# Patient Record
Sex: Male | Born: 1960 | Race: White | Hispanic: No | Marital: Married | Smoking: Former smoker
Health system: Southern US, Community
[De-identification: ages and names within clinical notes are randomized; demographics above are authoritative.]

---

## 2000-12-12 ENCOUNTER — Encounter: Payer: Self-pay | Admitting: Neurosurgery

## 2000-12-12 ENCOUNTER — Ambulatory Visit (HOSPITAL_COMMUNITY): Admission: RE | Admit: 2000-12-12 | Discharge: 2000-12-12 | Payer: Self-pay | Admitting: Neurosurgery

## 2000-12-14 ENCOUNTER — Ambulatory Visit (HOSPITAL_COMMUNITY): Admission: RE | Admit: 2000-12-14 | Discharge: 2000-12-15 | Payer: Self-pay | Admitting: Neurosurgery

## 2000-12-14 ENCOUNTER — Encounter: Payer: Self-pay | Admitting: Neurosurgery

## 2009-06-28 ENCOUNTER — Ambulatory Visit: Payer: Self-pay | Admitting: Family Medicine

## 2010-02-22 ENCOUNTER — Encounter: Admission: RE | Admit: 2010-02-22 | Discharge: 2010-02-22 | Payer: Self-pay | Admitting: Neurosurgery

## 2010-09-02 NOTE — Op Note (Signed)
[place]  Patient:    Mitchell Horton, Mitchell Horton Visit Number: [id number] MRN: [id number]          Service Type: DSU Location: 3000 [id number] Attending Physician:  Jackelyn Knife Dictated by:   Izell Marathon Elesa Hacker, M.D. Proc. 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  OPERATION PERFORMED:  Left L4, L5 hemilaminotomy and diskectomy.  SURGEON:  Izell [employer], M.D.  ASSISTANT:  ______ ______, M.D.  DESCRIPTION OF PROCEDURE:  Under general endotracheal anesthesia, the usual subperiosteal approach was carried out to the left L4-L5 interlaminar space. The proper operative level was confirmed with intraoperative x-rays.  Utilizing the operating microscope and high-speed drill, a hemilaminotomy was performed.  Ligament of flavum was removed to reveal the dura sac and nerve root.  The initial effort to retraction of these structures was hampered considerably by medially situated soft tissue mass, which proved to be a quite large completely extruding disk fragment.  This was removed and retraction was greatly facilitated.  One or two other smaller extruded fragments were found and removed.  The thinned annulus was then incised and an additional large quantity of degenerative disk material was removed from within the ______ .  A careful search was made for additional extruded fragments.  The wound was then copiously irrigated with antibiotic solution and closed in layers.  A sterile dressing was applied.  The patient was taken to recovery room in good condition. Dictated by:   Izell Butler Elesa Hacker, M.D. Attending Physician:  Jackelyn Knife DD:  12/14/00 TD:  12/14/00 Job: [phone] [id number]

## 2010-09-02 NOTE — H&P (Signed)
[place]  Patient:    Mitchell Horton Visit Number: [id number] MRN: [id number]          Service Type: DSU Location: [facility] [id number] Attending Physician:  Jackelyn Knife Dictated by:   Izell Sublette Elesa Hacker, M.D. Admit Date:  12/14/2000                           History and Physical  HISTORY:  Mitchell Horton is a 50 year old male known to this neurosurgical service.  He was operated upon here for a herniated intervertebral disk on the left at L5-S1 back in December of 1998.  He did well following that initial operation for several years, but had a recurrent problem and was reoperated upon by Dr. Michela Pitcher. Roy in 1996.  The patient believes that that was for right-sided pain.  Since the second operation, he has had some intermittent difficulty with back pain and some occasional left leg pain.  He had some conservative treatment including epidural steroids.  He had a repeat scan in May of 2002 that showed a new disk protrusion at L4-5 on the left side.  He continued to have left leg pain but this was manageable until about a week or 10 days or so ago when he had an abrupt change in his condition with a worsening of his left leg pain, which has been essentially incapacitating.  He was seen in this office on August 28th and because of a change of his condition, his MRI was repeated on that date at [facility] and this showed enlargement of the already large central disk herniation at L4-5, which had progressed since the Aug 16, 2000 scan.  The L5-S1 small disk protrusion was unchanged.  He is admitted at this time for surgical decompression at L4-5.  PAST MEDICAL HISTORY:  He has been in excellent general health.  FAMILY HISTORY:  His mother and father are living, both at age 56, in reasonably good health.  PREVIOUS SURGERY:  His previous surgery consists of laminectomies as noted above in 1988 and 1996.  CURRENT MEDICATIONS:  He has been on  Lipitor and Vicodin.  ALLERGIES:  He has no known drug allergies.  SOCIAL HISTORY:  Personal history reveals he does not smoke, nor does he use alcohol.  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  He is married.  REVIEW OF SYSTEMS:  Pertinent as mentioned.  PHYSICAL EXAMINATION:  GENERAL:  He is alert and cooperative.  He is in obvious left leg pain and tries to keep weight off of his left buttock and sciatic notch area.  VITAL SIGNS:  He weighs 197 pounds at 5-feet 7-inches tall with a blood pressure of 142/100 and a pulse of 82.  He is afebrile.  HEENT:  Examination is within normal limits.  NECK:  He has no cervical adenopathy, nor does he have bruits.  CHEST:  Clear.  CARDIAC:  Examination reveals no murmurs or enlargement.  ABDOMEN:  Soft and nontender with no palpable masses.  NEUROLOGIC:  Examination reveals sensorium and cranial nerves to be intact. 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  Straight leg raising is negative on the right and extremely painful on the left.  His dorsiflexor strength may be slightly down of the foot and great toe on the left.  There is a good deal of pain produced by  testing and it is difficult to be sure of this, but I think that is an accurate finding.  Romberg testing is negative.  He can walk on his toes but has difficulty walking on his heels because of pain and what I believe to be some dorsiflexor weakness.  He has some loss of sensation slightly over S1 and more so on the left over L5.  IMPRESSION:  Progression of a large disk herniation at L4-5 with a stable L5-S1 right-sided protrusion which has not changed on scan over the last eight years.  RECOMMENDATION:  I have recommended surgical decompression on the left at L4-5.  I discussed this in detail with him and he understands the goals and risks of the procedure.  He knows the risks  include the risks of hemorrhage, infection, damage to common dural tube or nerve roots with resultant loss of sensation and weakness.Dictated by:   Izell  Elesa Hacker, M.D. Attending Physician:  Jackelyn Knife DD:  12/14/00 TD:  12/14/00 Job: [phone] [id number]

## 2014-02-27 ENCOUNTER — Ambulatory Visit: Payer: Self-pay | Admitting: Gastroenterology

## 2014-04-21 ENCOUNTER — Ambulatory Visit: Payer: Self-pay | Admitting: Surgery

## 2014-05-13 ENCOUNTER — Ambulatory Visit: Payer: Self-pay | Admitting: Surgery

## 2014-08-10 LAB — SURGICAL PATHOLOGY

## 2014-10-19 ENCOUNTER — Emergency Department
Admission: EM | Admit: 2014-10-19 | Discharge: 2014-10-19 | Disposition: A | Discharge status: Home / Self Care | Payer: BC Managed Care – PPO | Attending: Emergency Medicine | Admitting: Emergency Medicine

## 2014-10-19 ENCOUNTER — Encounter: Payer: Self-pay | Admitting: *Deleted

## 2014-10-19 DIAGNOSIS — Z87891 Personal history of nicotine dependence: Secondary | ICD-10-CM | POA: Insufficient documentation

## 2014-10-19 DIAGNOSIS — Y93G9 Activity, other involving cooking and grilling: Secondary | ICD-10-CM | POA: Insufficient documentation

## 2014-10-19 DIAGNOSIS — W290XXA Contact with powered kitchen appliance, initial encounter: Secondary | ICD-10-CM | POA: Insufficient documentation

## 2014-10-19 DIAGNOSIS — Y9289 Other specified places as the place of occurrence of the external cause: Secondary | ICD-10-CM | POA: Diagnosis not present

## 2014-10-19 DIAGNOSIS — S61011A Laceration without foreign body of right thumb without damage to nail, initial encounter: Secondary | ICD-10-CM | POA: Insufficient documentation

## 2014-10-19 DIAGNOSIS — Y998 Other external cause status: Secondary | ICD-10-CM | POA: Insufficient documentation

## 2014-10-19 MED ORDER — BACITRACIN-NEOMYCIN-POLYMYXIN 400-5-5000 EX OINT
TOPICAL_OINTMENT | CUTANEOUS | Status: AC
  Filled 2014-10-19: qty 1

## 2014-10-19 MED ORDER — TETANUS-DIPHTHERIA TOXOIDS TD 5-2 LFU IM INJ
INJECTION | INTRAMUSCULAR | Status: AC
  Filled 2014-10-19: qty 0.5

## 2014-10-19 MED ORDER — BACITRACIN-NEOMYCIN-POLYMYXIN OINTMENT TUBE
TOPICAL_OINTMENT | Freq: Once | CUTANEOUS | Status: AC
  Administered 2014-10-19: 1 via TOPICAL
  Filled 2014-10-19: qty 15

## 2014-10-19 MED ORDER — TETANUS-DIPHTHERIA TOXOIDS TD 5-2 LFU IM INJ
0.5000 mL | INJECTION | Freq: Once | INTRAMUSCULAR | Status: AC
  Administered 2014-10-19: 0.5 mL via INTRAMUSCULAR

## 2014-10-19 MED ORDER — TRAMADOL HCL 50 MG PO TABS: 50.0000 mg | ORAL_TABLET | Freq: Four times a day (QID) | ORAL | Status: AC | PRN

## 2014-10-19 MED ORDER — LIDOCAINE HCL (PF) 1 % IJ SOLN
INTRAMUSCULAR | Status: AC
  Filled 2014-10-19: qty 5

## 2014-10-19 NOTE — ED Notes (Signed)
Pt has  right thumb laceration.  States cut on blade of nija blender.  Bleeding controlled.

## 2014-10-19 NOTE — ED Provider Notes (Signed)
[facility] Emergency Department Provider Note  ____________________________________________  Time seen: Approximately 5:49 PM  I have reviewed the triage vital signs and the nursing notes.   HISTORY  Chief Complaint Extremity Laceration    HPI Mitchell Horton is a 54 y.o. male right thumb laceration secondary to a metal cut. Patient cut his thumb on a blender. Bleeding was initially refused but controlled with direct pressure. Patient denies any loss sensation or loss of function. She rated his pain as a 1/10. Patient stated flex and the thumb increases the bleeding.   No past medical history on file.  There are no active problems to display for this patient.   No past surgical history on file.  No current outpatient prescriptions on file.  Allergies Review of patient's allergies indicates no known allergies.  No family history on file.  Social History History  Substance Use Topics  . Smoking status: Former Games developermoker  . Smokeless tobacco: Not on file  . Alcohol Use: Yes    Review of Systems Constitutional: No fever/chills Eyes: No visual changes. ENT: No sore throat. Cardiovascular: Denies chest pain. Respiratory: Denies shortness of breath. Gastrointestinal: No abdominal pain.  No nausea, no vomiting.  No diarrhea.  No constipation. Genitourinary: Negative for dysuria. Musculoskeletal: Negative for back pain. Skin: Negative for rash. Laceration right thumb. Neurological: Negative for headaches, focal weakness or numbness. 10-point ROS otherwise negative.  ____________________________________________   PHYSICAL EXAM:  VITAL SIGNS: ED Triage Vitals  Enc Vitals Group     BP 10/19/14 1703 150/92 mmHg     Pulse Rate 10/19/14 1703 75     Resp 10/19/14 1703 20     Temp 10/19/14 1703 98.1 F (36.7 C)     Temp Source 10/19/14 1703 Oral     SpO2 10/19/14 1703 96 %     Weight 10/19/14 1703 235 lb (106.595 kg)     Height 10/19/14 1703  5\' 7"  (1.702 m)     Head Cir --      Peak Flow --      Pain Score 10/19/14 1704 1     Pain Loc --      Pain Edu? --      Excl. in GC? --     Constitutional: Alert and oriented. Well appearing and in no acute distress. Eyes: Conjunctivae are normal. PERRL. EOMI. Head: Atraumatic. Nose: No congestion/rhinnorhea. Mouth/Throat: Mucous membranes are moist.  Oropharynx non-erythematous. Neck: No stridor. No cervical spine tenderness to palpation. Hematological/Lymphatic/Immunilogical: No cervical lymphadenopathy. Cardiovascular: Normal rate, regular rhythm. Grossly normal heart sounds.  Good peripheral circulation. Mild elevation of blood pressure. Respiratory: Normal respiratory effort.  No retractions. Lungs CTAB. Gastrointestinal: Soft and nontender. No distention. No abdominal bruits. No CVA tenderness. Musculoskeletal: No lower extremity tenderness nor edema.  No joint effusions. Neurologic:  Normal speech and language. No gross focal neurologic deficits are appreciated. Speech is normal. No gait instability. Skin:  Skin is warm, dry and intact. No rash noted. 1.5 cm laceration right volar thumb. Bleeding controlled with direct pressure. Incisions intact full and equal range of motion. Psychiatric: Mood and affect are normal. Speech and behavior are normal.  ____________________________________________   LABS (all labs ordered are listed, but only abnormal results are displayed)  Labs Reviewed - No data to display ____________________________________________  EKG   ____________________________________________  RADIOLOGY   ____________________________________________   PROCEDURES  Procedure(s) performed: See procedure note Critical Care performed: No  _______LACERATION REPAIR Performed by: Joni Reiningonald K Smith Authorized by:  Joni Reining Consent: Verbal consent obtained. Risks and benefits: risks, benefits and alternatives were discussed Consent given by:  patient Patient identity confirmed: provided demographic data Prepped and Draped in normal sterile fashion Wound explored  Laceration Location: Right thumb  Laceration Length: 1.5 cm  No Foreign Bodies seen or palpated  Anesthesia: Digital block   Local anesthetic: lidocaine 1% with epinephrine   Anesthetic total: 4 ML  Irrigation method: syring  Amount of cleaning: standard  Skin closure: 40 proline  Number of sutures: 5 Technique: Interrupted Patient tolerance: Patient tolerated the procedure well with no immediate complications. _____________________________________   INITIAL IMPRESSION / ASSESSMENT AND PLAN / ED COURSE  Pertinent labs & imaging results that were available during my care of the patient were reviewed by me and considered in my medical decision making (see chart for details).  Laceration right thumb. Area was sutured. Patient put in a splint secondary to location of the laceration. Patient discharged prescription for tramadol use as needed for pain. Tetanus booster was given in the ER. Patient advised return back 10 days for suture removal sooner signs and symptoms of infection develop. ____________________________________________   FINAL CLINICAL IMPRESSION(S) / ED DIAGNOSES  Final diagnoses:  Thumb laceration, right, initial encounter      Joni Reining, PA-C 10/19/14 1758  Sharman Cheek, MD 10/19/14 2356

## 2014-10-19 NOTE — Discharge Instructions (Signed)
Wear splint for2-3 days.

## 2014-10-19 NOTE — ED Notes (Signed)
Wound sutured and dressed with gauze and 2x2's   D/c int to pt

## 2016-08-24 IMAGING — MR MRI OF THE RIGHT KNEE WITHOUT CONTRAST
5 series · 38 of 40 positions shown · non-contrast
Comparison: None.

CLINICAL DATA: Medial right knee pain for 2 months. No known
injury.

EXAM:
MRI OF THE RIGHT KNEE WITHOUT CONTRAST
TECHNIQUE: Multiplanar, multisequence MR imaging of the knee was performed. No
intravenous contrast was administered.

[Series 3: PD fat-sat · axial · 3.0mm · 0.31mm/px · z∈[-45,+67]mm · 8 of 35 slices shown (1 of 3)]
[im 1/35]
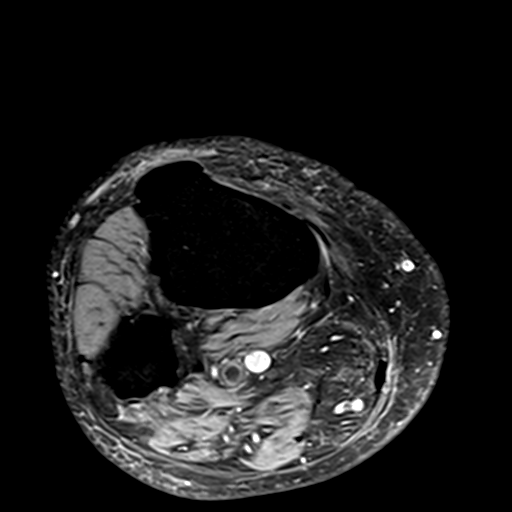
[im 5/35]
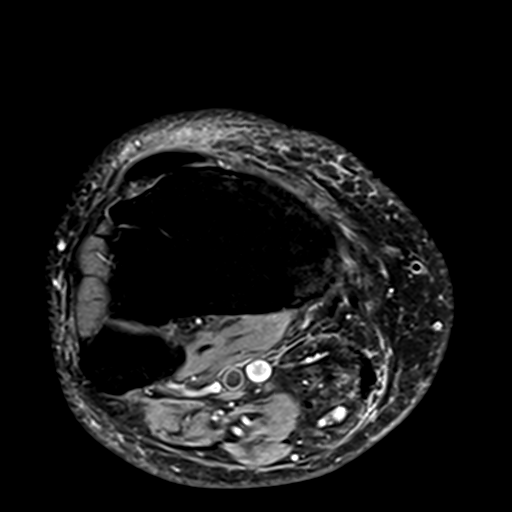
[im 10/35]
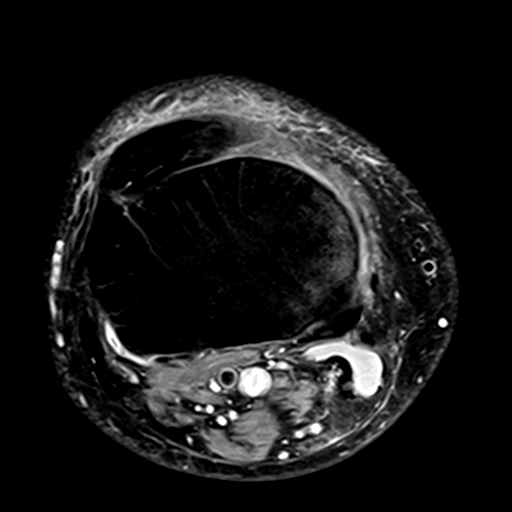
[im 15/35]
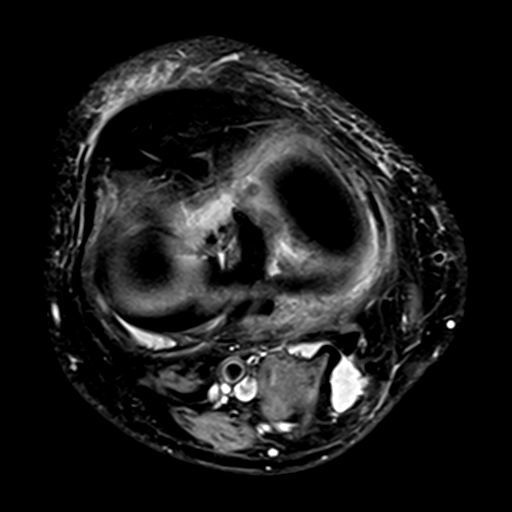
[im 20/35]
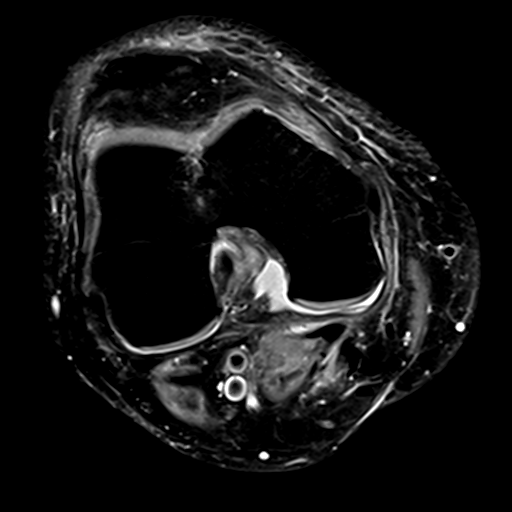
[im 25/35]
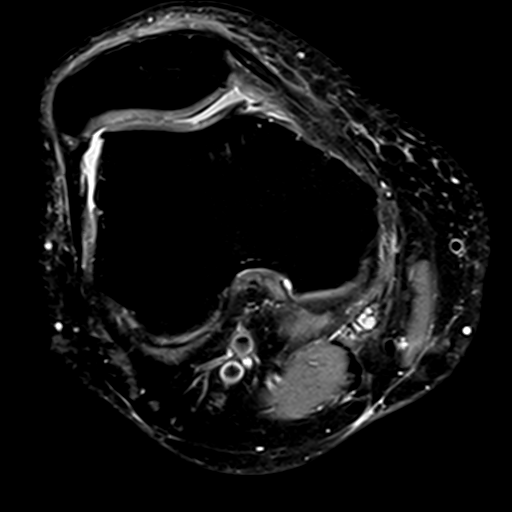
[im 30/35]
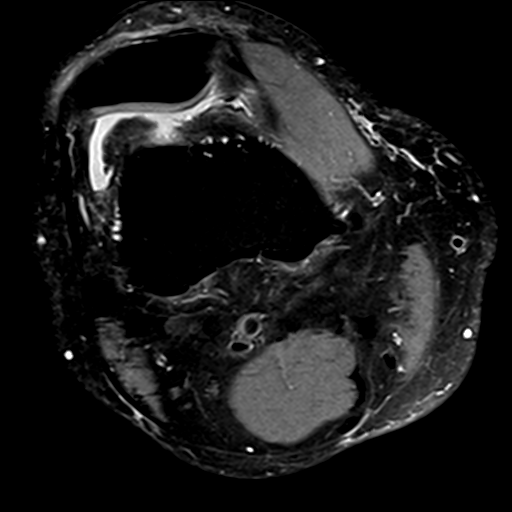
[im 35/35]
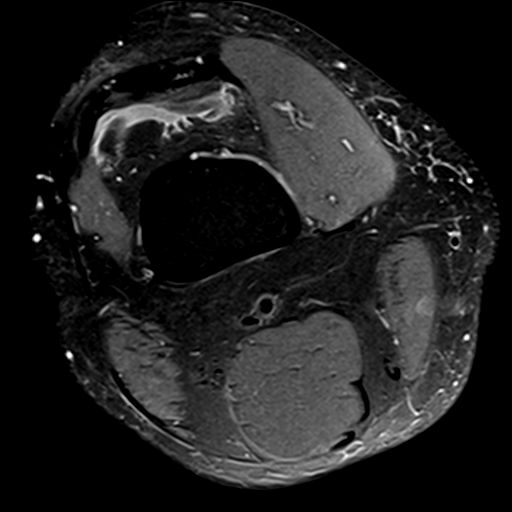

[Series 5: T2 fat-sat · coronal · 3.0mm · 0.50mm/px · 8 of 36 slices shown]
[im 1/36]
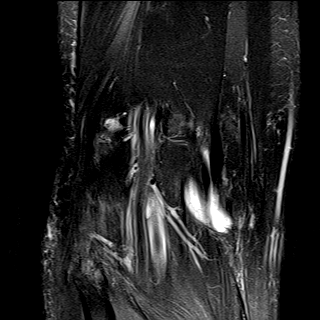
[im 6/36]
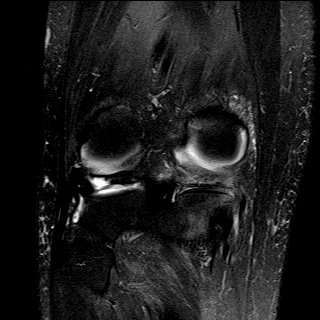
[im 11/36]
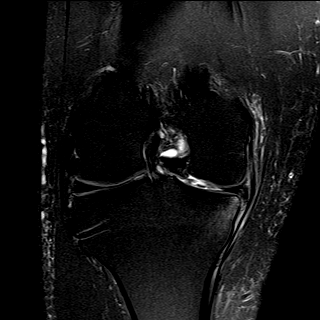
[im 16/36]
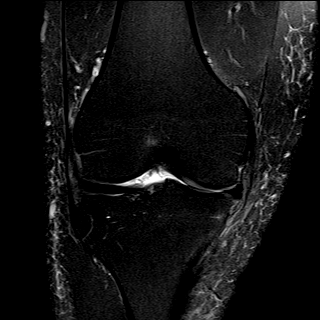
[im 21/36]
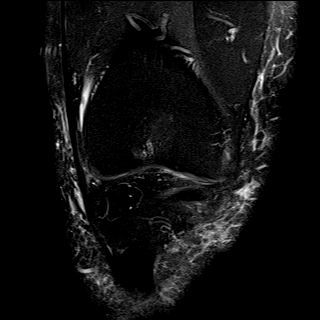
[im 26/36]
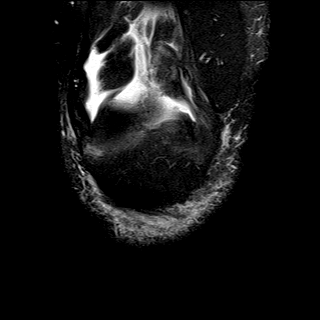
[im 31/36]
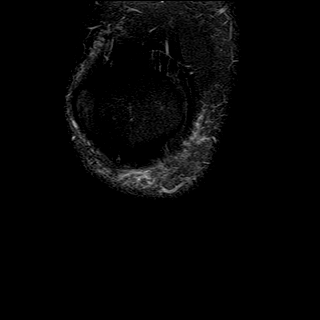
[im 36/36]
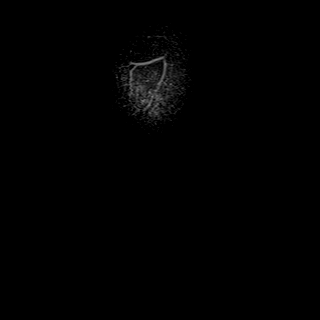

[Series 6: PD fat-sat · sagittal · 3.0mm · 0.62mm/px · 8 of 34 slices shown (2 of 3)]
[im 1/34]
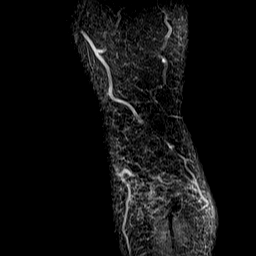
[im 5/34]
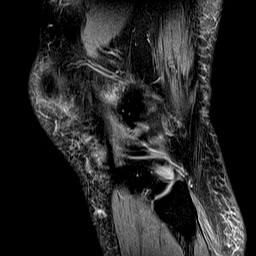
[im 10/34]
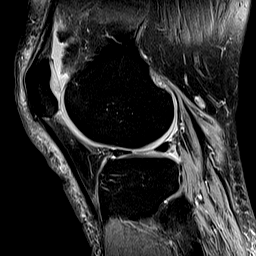
[im 15/34]
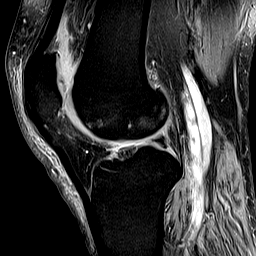
[im 19/34]
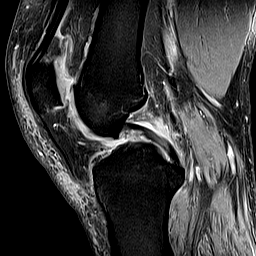
[im 24/34]
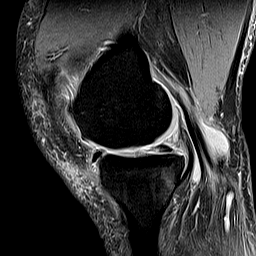
[im 29/34]
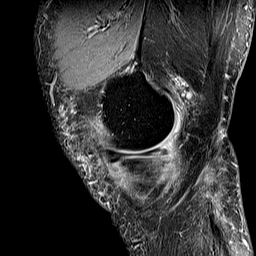
[im 34/34]
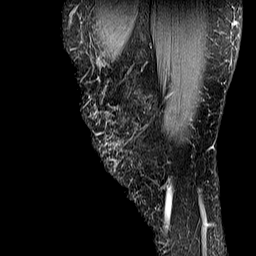

[Series 7: PD fat-sat · coronal · 3.0mm · 0.62mm/px · 8 of 36 slices shown (3 of 3)]
[im 1/36]
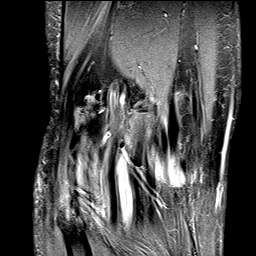
[im 6/36]
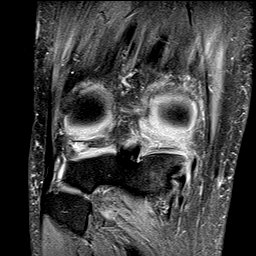
[im 11/36]
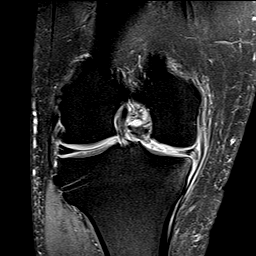
[im 16/36]
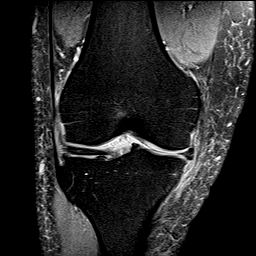
[im 21/36]
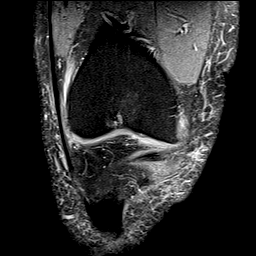
[im 26/36]
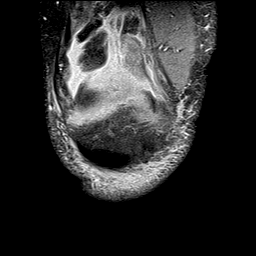
[im 31/36]
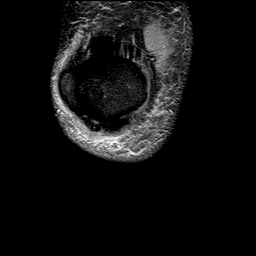
[im 36/36]
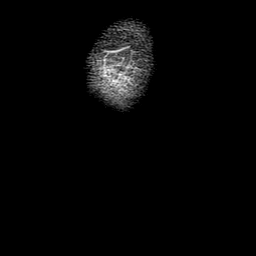

[Series 100: T1 · coronal · 3.0mm · 0.50mm/px · 6 of 36 slices shown]
[im 1/36]
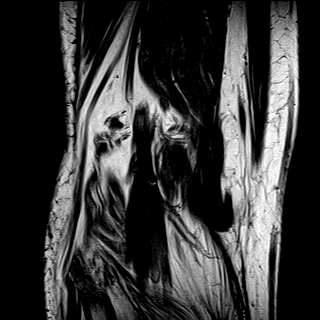
[im 6/36]
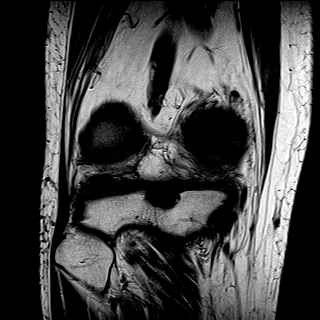
[im 11/36]
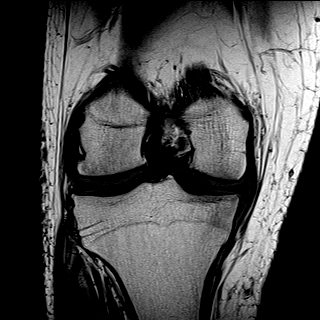
[im 16/36]
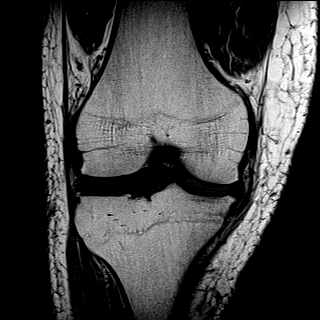
[im 21/36]
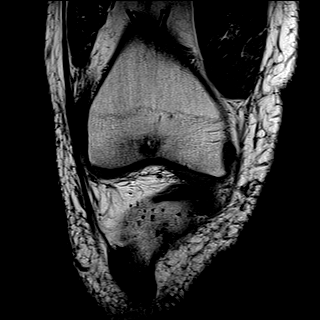
[im 26/36]
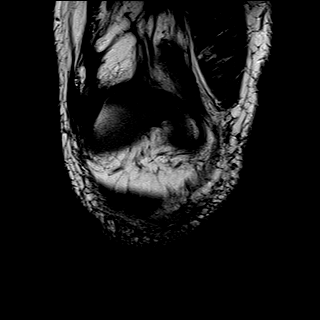

[38 of 40 positions shown; findings below may reference images not displayed]

FINDINGS: MENISCI

Medial meniscus: A large oblique tear in the posterior horn of the
medial meniscus reaches the meniscal undersurface and extends to the
junction the posterior horn and body without displaced fragment.
Fraying along the free edge of the body of the medial meniscus is
also identified.

Lateral meniscus:  Intact.

LIGAMENTS

Cruciates:  Intact.

Collaterals:  Intact.

CARTILAGE

Patellofemoral: Partial thickness fissuring of hyaline cartilage is
seen at the apex of the patella, along the lateral facet and
centrally in the femoral trochlea. Small subchondral cysts are seen
in the inferior margin of the femoral trochlea centrally.

Medial:  Hyaline cartilage is thinned and irregular throughout.

Lateral:  Minimally degenerated.

Joint:  Small joint effusion is seen.

Popliteal Fossa: Small Baker's cyst measures 2.5 cm AP x 0.8 cm
transverse x 3.8 cm craniocaudal.

Extensor Mechanism: Intact. Increased T2 signal is seen within the
central fibers the superior patellar tendon consistent with
tendinosis without tear.

Bones: Mild marrow edema seen about the periphery of the medial
compartment.
IMPRESSION: Large tear posterior horn medial meniscus without displaced
fragment. Fraying along the free edge of the body of the medial
meniscus also identified.

Tendinosis of the superior fibers of the patellar tendon without
tear.

Degenerative change most notable in the medial and patellofemoral
compartments.

Small Baker's cyst.
